# Patient Record
Sex: Male | Born: 1970 | Race: White | Hispanic: No | Marital: Single | Smoking: Current every day smoker
Health system: Southern US, Community
[De-identification: ages and names within clinical notes are randomized; demographics above are authoritative.]

## PROBLEM LIST (undated history)

## (undated) HISTORY — PX: APPENDECTOMY: SHX54

---

## 1999-10-07 ENCOUNTER — Ambulatory Visit (HOSPITAL_COMMUNITY): Admission: RE | Admit: 1999-10-07 | Discharge: 1999-10-07 | Payer: Self-pay | Admitting: *Deleted

## 1999-10-13 ENCOUNTER — Ambulatory Visit (HOSPITAL_COMMUNITY): Admission: RE | Admit: 1999-10-13 | Discharge: 1999-10-13 | Payer: Self-pay | Admitting: *Deleted

## 2000-03-05 ENCOUNTER — Emergency Department (HOSPITAL_COMMUNITY): Admission: EM | Admit: 2000-03-05 | Discharge: 2000-03-05 | Payer: Self-pay | Admitting: Emergency Medicine

## 2001-05-23 ENCOUNTER — Inpatient Hospital Stay (HOSPITAL_COMMUNITY): Admission: RE | Admit: 2001-05-23 | Discharge: 2001-05-23 | Payer: Self-pay | Admitting: Neurosurgery

## 2001-05-23 ENCOUNTER — Encounter: Payer: Self-pay | Admitting: Neurosurgery

## 2002-05-15 ENCOUNTER — Encounter: Payer: Self-pay | Admitting: Neurosurgery

## 2002-05-15 ENCOUNTER — Observation Stay (HOSPITAL_COMMUNITY): Admission: RE | Admit: 2002-05-15 | Discharge: 2002-05-15 | Payer: Self-pay | Admitting: Neurosurgery

## 2002-12-23 ENCOUNTER — Emergency Department (HOSPITAL_COMMUNITY): Admission: EM | Admit: 2002-12-23 | Discharge: 2002-12-24 | Payer: Self-pay | Admitting: Emergency Medicine

## 2002-12-24 ENCOUNTER — Encounter: Payer: Self-pay | Admitting: Emergency Medicine

## 2003-09-23 ENCOUNTER — Inpatient Hospital Stay (HOSPITAL_COMMUNITY): Admission: RE | Admit: 2003-09-23 | Discharge: 2003-09-24 | Payer: Self-pay | Admitting: Neurosurgery

## 2008-06-27 ENCOUNTER — Emergency Department (HOSPITAL_COMMUNITY): Admission: EM | Admit: 2008-06-27 | Discharge: 2008-06-27 | Payer: Self-pay | Admitting: Emergency Medicine

## 2008-09-21 ENCOUNTER — Ambulatory Visit (HOSPITAL_COMMUNITY): Admission: RE | Admit: 2008-09-21 | Discharge: 2008-09-22 | Payer: Self-pay | Admitting: Neurosurgery

## 2009-01-22 ENCOUNTER — Encounter: Admission: RE | Admit: 2009-01-22 | Discharge: 2009-01-22 | Payer: Self-pay | Admitting: Neurosurgery

## 2009-02-18 ENCOUNTER — Inpatient Hospital Stay (HOSPITAL_COMMUNITY): Admission: RE | Admit: 2009-02-18 | Discharge: 2009-02-19 | Payer: Self-pay | Admitting: Neurosurgery

## 2010-07-20 IMAGING — CT CT L SPINE W/O CM
4 of 10 series · 12 of 33 positions shown, 14 images · non-contrast
Comparison: Intraoperative fluoroscopic films 09/21/2008.

CLINICAL DATA: Low back pain.  Evaluate hardware.  Status post L4-
L5 fusion.

CT LUMBAR SPINE WITHOUT CONTRAST
TECHNIQUE: Multidetector CT imaging of the lumbar spine was
performed without intravenous contrast administration. Multiplanar
CT image reconstructions were also generated.

[Series 3: bone windows · axial · 0.27mm/px · z∈[-236,-158]mm · 2 of 93 slices shown, 3 images]
[im 31/93  soft-tissue]
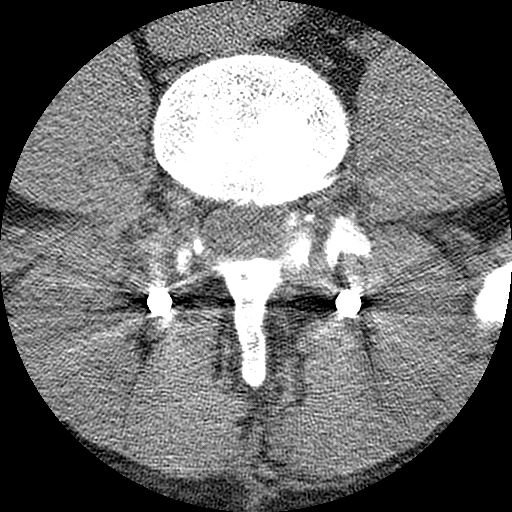
[im 31/93  bone]
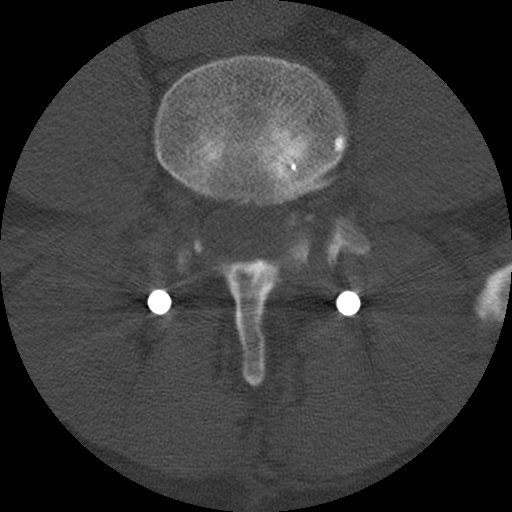
[im 62/93  bone]
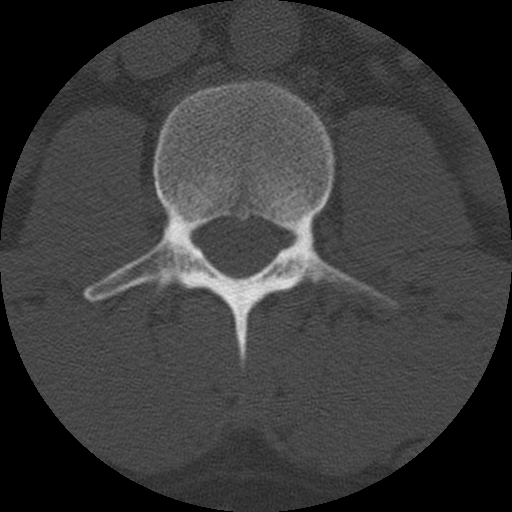

[Series 4: detail windows · axial · 0.27mm/px · z∈[-236,-158]mm · 2 of 93 slices shown]
[im 31/93  bone]
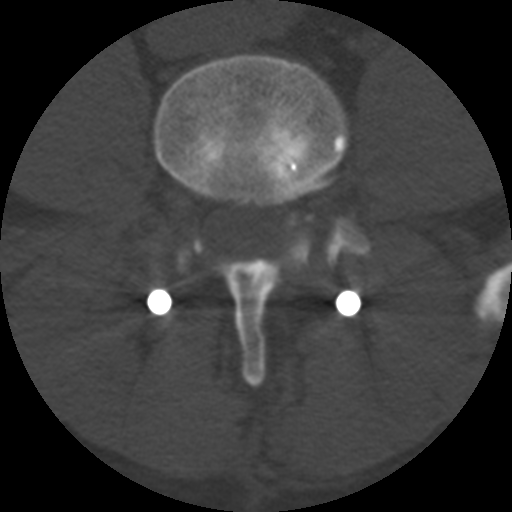
[im 62/93  bone]
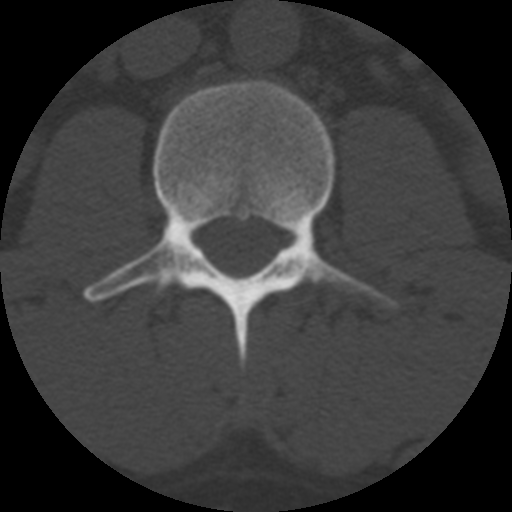

[Series 200: cor · coronal · 0.46mm/px · 3 of 40 slices shown]
[im 8/40  bone]
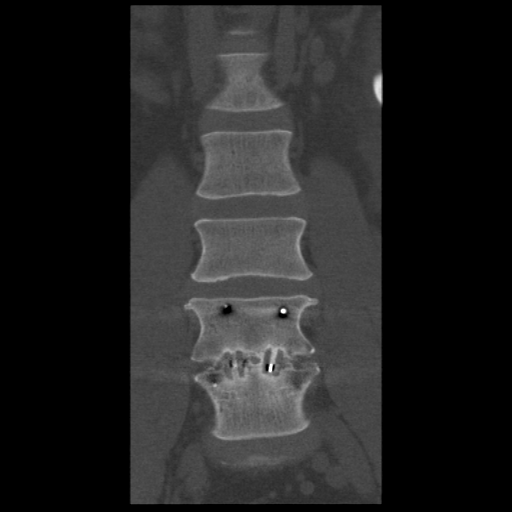
[im 16/40  bone]
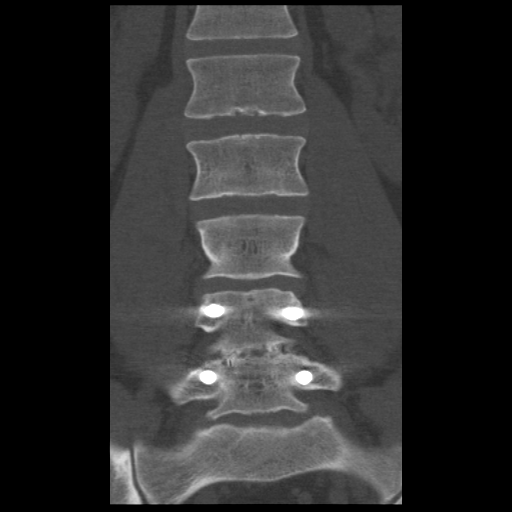
[im 24/40  bone]
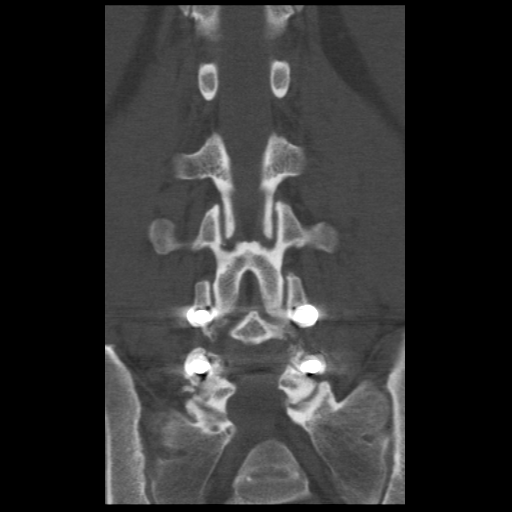

[Series 201: sag · sagittal · 0.46mm/px · 5 of 39 slices shown, 6 images]
[im 13/39  bone]
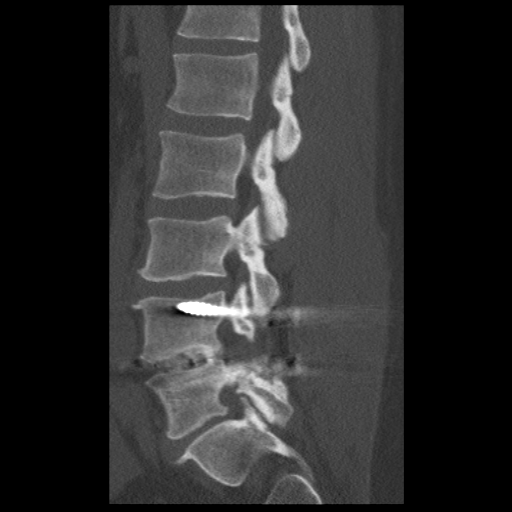
[im 16/39  bone]
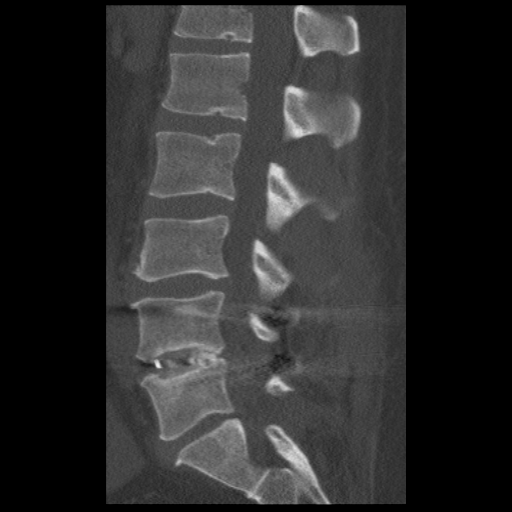
[im 20/39  soft-tissue]
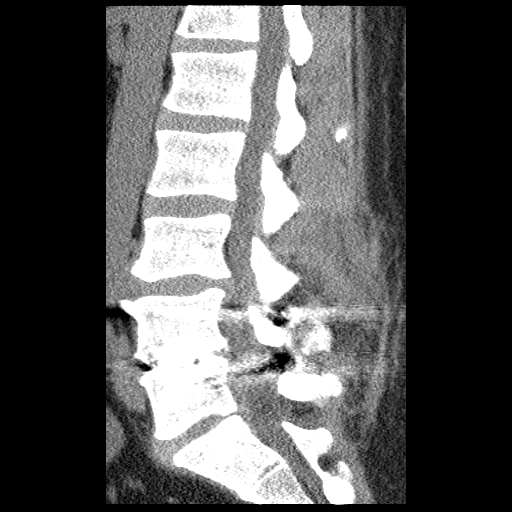
[im 20/39  bone]
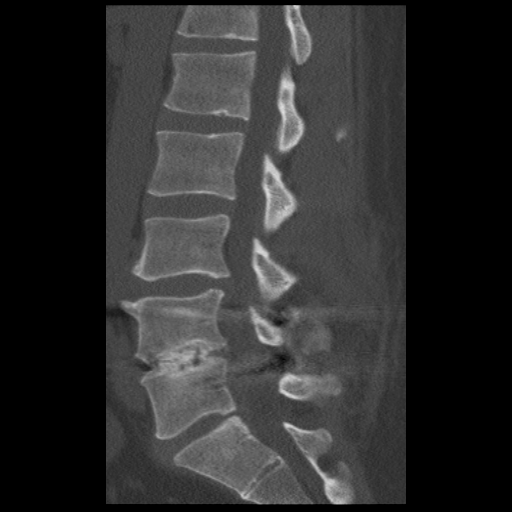
[im 23/39  bone]
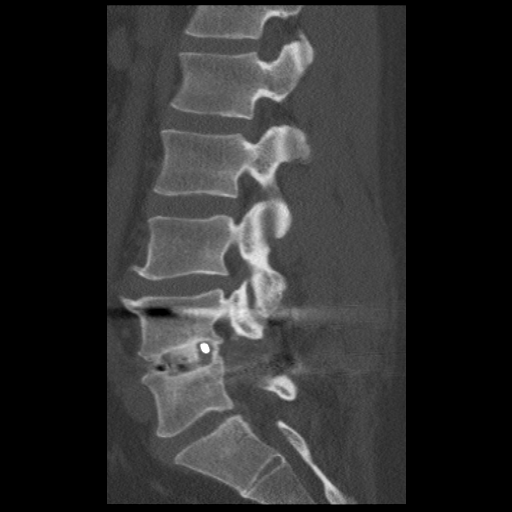
[im 26/39  bone]
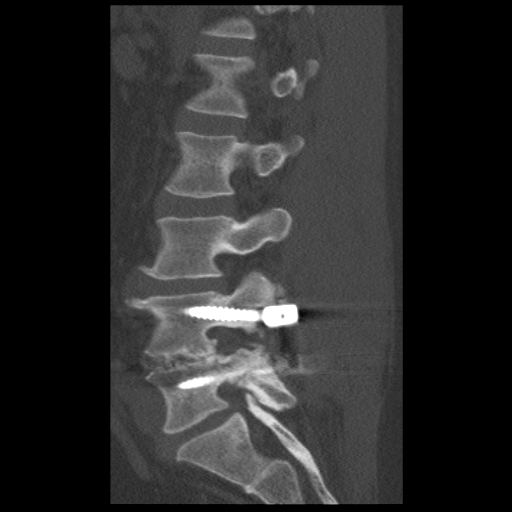

[12 of 33 positions shown; findings below may reference images not displayed]

FINDINGS: Alignment of the lumbar spine shows grade 1
retrolisthesis of L3 on L4.  PLIF at L4-L5.  The screws at L4-L5
are well positioned, without intrusion into the neural foramina.
T12-L1, L1-L2, and L2-L3 appear within normal limits.

L3-L4:  Disc degeneration with anterior osteophytes and broad-based
posterior disc protrusion which is partially calcified.  There is
moderate central canal stenosis associated with disc protrusion and
facet hypertrophy.  No definite foraminal narrowing is identified.

L4-L5:  Posterior lumbar interbody fusion with bilateral
facetectomy and bilateral laminotomies.  There is no evidence of
hardware loosening or failure.  Bridging bone crosses the
interspace.  Interbody bone graft is in good position. No
recurrent/residual stenosis.

L5-S1:  Bilateral L5 pars defects are present without
anterolisthesis.  Right L5 laminotomy.  Broad-based disc bulge with
small foraminal endplate osteophytes on the right with moderate
right foraminal stenosis.  Mild left foraminal stenosis associated
with spurring at the site of pars defect.  Bulging disc
contributes.  Small endplate osteophytes.

Sacrum and SI joints appear within normal limits.
IMPRESSION: 1.  Uncomplicated L4-L5 PLIF with good decompression.  No evidence
of hardware failure or loosening.  Solid fusion across the disc
space.
2.  Bilateral L5 pars defects without anterolisthesis.  Right L5
laminotomy.  Mild bilateral foraminal narrowing associated with
spurring from pseudoarthrosis and bulging disc, right greater than
left.
3.  L3-L4 degenerative disc disease with mild central stenosis.

## 2010-11-06 LAB — CBC
HCT: 50 % (ref 39.0–52.0)
Hemoglobin: 17.3 g/dL — ABNORMAL HIGH (ref 13.0–17.0)
MCHC: 34.5 g/dL (ref 30.0–36.0)
MCV: 91.2 fL (ref 78.0–100.0)
Platelets: 236 10*3/uL (ref 150–400)
RBC: 5.48 MIL/uL (ref 4.22–5.81)
RDW: 13.1 % (ref 11.5–15.5)
WBC: 7.7 10*3/uL (ref 4.0–10.5)

## 2010-11-06 LAB — TYPE AND SCREEN
ABO/RH(D): O POS
Antibody Screen: NEGATIVE

## 2010-11-06 LAB — ABO/RH: ABO/RH(D): O POS

## 2010-11-15 LAB — CBC
HCT: 44.1 % (ref 39.0–52.0)
Hemoglobin: 15.5 g/dL (ref 13.0–17.0)
MCHC: 35.2 g/dL (ref 30.0–36.0)
MCV: 89.4 fL (ref 78.0–100.0)
Platelets: 225 10*3/uL (ref 150–400)
RBC: 4.93 MIL/uL (ref 4.22–5.81)
RDW: 14.3 % (ref 11.5–15.5)
WBC: 6.7 10*3/uL (ref 4.0–10.5)

## 2010-12-13 NOTE — Op Note (Signed)
NAME:  [name] [name], [name] [name]              ACCOUNT NO.:  [id number]   MEDICAL RECORD NO.:  [id number]          PATIENT TYPE:  INP   LOCATION:  3009                         FACILITY:  [facility]   PHYSICIAN:  Hewitt Shorts, M.D.DATE OF BIRTH:  1971-03-18   DATE OF PROCEDURE:  DATE OF DISCHARGE:                               OPERATIVE REPORT   PREOPERATIVE DIAGNOSES:  Recurrent right L5-S1 lumbar disk herniation,  lumbar degenerative disk disease, lumbar spondylosis, bilateral L5 pars  interarticularis defect and right lumbar radiculopathy.   POSTOPERATIVE DIAGNOSES:  Recurrent right L5-S1 lumbar disk herniation,  lumbar degenerative disk disease, lumbar spondylosis, bilateral L5 pars  interarticularis defect and right lumbar radiculopathy.   PROCEDURE:  L5 Gill procedure with laminectomy, facetectomy, and  foraminotomy with decompression of the exiting L5 and S1 nerve roots  bilaterally with decompression beyond that required for interbody  fusion, bilateral L5-S1 lumbar diskectomy with microdissection with  specific removal including removal of recurrent right L5-S1 disk  herniation and bilateral L5-S1 posterior lumbar interbody fusion with  AVS PEEK interbody implants and mosaic with bone marrow aspirate and  Infuse and bilateral L5-S1 posterolateral arthrodesis with mosaic with  bone marrow aspirates and Infuse.   SURGEON:  Hewitt Shorts, M.D.   ASSISTANTS:  Nelia Shi. Webb Silversmith, RN and Danae Orleans. Venetia Maxon, M.D.   ANESTHESIA:  General endotracheal.   INDICATIONS:  The patient is a 40 year old man, he is status post  previous L4-5 decompression, PLIF and TLA, in 2005 with 90D posterior  instrumentation, status post a right L5-S1 diskectomy earlier this year,  suffered recurrent disk herniation and a decision was made to proceed  with decompression and arthrodesis.  Preoperative CT to assess the  fusion at L4-5 revealed bilateral L5 pars interarticularis defect.  The  patient had 90D  instrumentation previous L4-5 fusion, however, the plan  was to place radius screws at S1.   PROCEDURE IN DETAIL:  The patient was brought to the operating room and  placed under general endotracheal anesthesia.  The patient was turned to  a prone position.  Lumbar region was prepped with Betadine soap and  solution and draped in a sterile fashion.  The midline was infiltrated  with local anesthetic with epinephrine.  The previous midline incision  was reopened and extended rostrally and caudally.  Dissection was  carried down to the subcutaneous tissue to the lumbar fascia which was  incised bilaterally.  The paraspinal muscles were dissected from the  spinous process and lamina in a subperiosteal fashion.  Particular care  was taken because of previous bilateral  laminotomies at L4-5  bilaterally as well as the right L5-S1 laminotomy.  An x-ray was taken.  We identified the L4-L5 and S1 spinous processes and in fact it was  noted that the posterior elements of L5 were loose consistent with  bilateral pars defect.   Dissection was then carried out laterally exposing the posterior  instrumentation bilaterally at L4-5.  We exposed the L5-S1 interlaminar  space including the previous laminotomy on the right side and then we  proceeded with decompression.  The operating microscope was  draped and  brought to the field to provide additional navigation, illumination, and  visualization.  The remainder of the decompression was performed using  microdissection and microsurgical technique.  The laminotomies were  extended bilaterally and then we continued the decompression to a full  laminectomy and facetectomy was performed because this was an L5 Gill  procedure.  We exposed the superior articular process S1 and the  transverse process of L5.  Later in the case, these structures were  decorticated, has had good bony surfaces for the arthrodesis.   We began the diskectomy at L5-S1 on the left  side, annulus was  identified.  The overlying epidural veins were identified, coagulated  and divided.  The annulus was incised.  The disk space was entered using  a variety of microcurettes and pituitary rongeurs.  A thorough  diskectomy was performed.  We then began to prepare the endplates using  paddle curettes.  We removed the cauterized endplates surface and the  disk space was sized.  We then proceeded with dissection on the right  side with a previous laminotomy.  Diskectomy has been done.  There was  moderate amount of scar tissue.  We identified the L5 nerve root and the  S1 nerve root, slowly opened the scar tissue and found a moderate-sized  disk herniation compressing the exiting right S1 nerve root.  This was  removed in a piecemeal fashion.  We then entered into the disk space and  continued diskectomy using a variety of microcurettes and pituitary  rongeurs.  We then began preparing the end plate on the right side of  the L5-S1 with a paddle curettes to remove the cartilaginous endplate  surface and again the disk space was sized.   We then removed the operating microscope and brought in the C-arm  fluoroscope.  We identified the left S1 pedicle and was probed with a  pedicle probe.  Bone mass was aspirated from the vertebral body which  was injected over a 15-mL strip of Mosaic and then we selected 9 x 25 x  4 degree AVS PEEK implants.  We packed with Mosaic, then small pledget  of Infuse, then more Mosaic and then we first placed the implants on the  right side retracting the thecal sac and nerve root was gently tapped  into position.  Going to the left side we packed the midline with  additional Mosaic with bone marrow aspirate.  Then, we placed the second  implant, again retracting the thecal sac and nerve root medially on the  left side.  Both the implants were countersunk.  We then went ahead and  probed the right S1 pedicle again using the C-arm fluoroscopic  guidance.  The S1 pedicles were examined with ball probe, good bony surfaces were  noted.  Each was tapped with a 5.25 mm tap.  Again, examined with a ball  probe, good bony surface was noted, good threading was noted, then we  placed a 5.75 x 35 mm screws bilaterally at S1.   We went ahead and removed the locking caps from the 90D posterior  instrumentation at L4-5.  All 4 locking caps were marked then we removed  the rods.  The screw heads were cleaned of scar tissue and then we  selected a prelordosed radius rods 60 mm on the right, 50 mm on the  left, good placement of screw heads and locked down with locking caps  using radius locking caps on the S1 radius screws  and 90D locking caps  on the L4 and L5, 90D screws.  Once all 6 locking caps were in place,  final tightening was done with the appropriate final tightener.  We then  packed the remaining Infuse and Mosaic with bone marrow aspirate in the  lateral gutter at the L5-S1 level.  Once that was completed, we  reexamined the spinal canal, thecal sac, and the L5 and S1 nerve roots  which were well decompressed bilaterally, and then we proceeded with the  closure.  The wound had been irrigated numerous times during the  procedure with bacitracin solution and saline solution.  The paraspinal  muscle was reapproximated with interrupted undyed #1 Vicryl sutures.  The deep fascia was closed with interrupted undyed #1 Vicryl sutures.  Scarpa fascia was closed with interrupted undyed #1 Vicryl sutures.  The  subcutaneous and subcuticular were closed with interrupted inverted 2-0  undyed Vicryl sutures.  The skin was reapproximated with Dermabond.  The  wound was dressed with Adaptic, sterile gauze, and Hyperfix.  The  procedure was tolerated well.  We did use a cell saver during the  procedure, but there was insufficient blood loss to process the  collected blood.  The estimated blood loss was 150 mL.  Sponge and  needle count were  correct.  Following surgery, the patient was turned  back to the supine position, to be reversed from the anesthetic,  extubated, and transferred to the recovery room for further care.     Hewitt Shorts, M.D.  Electronically Signed    RWN/MEDQ  D:  02/18/2009  T:  02/19/2009  Job:  [id number]

## 2010-12-13 NOTE — Op Note (Signed)
NAME:  [name] [name]              ACCOUNT NO.:  [id number]   MEDICAL RECORD NO.:  [id number]           PATIENT TYPE:   LOCATION:                                 FACILITY:   PHYSICIAN:  Hewitt Shorts, M.D.DATE OF BIRTH:  Sep 21, 1970   DATE OF PROCEDURE:  DATE OF DISCHARGE:                               OPERATIVE REPORT   PREOPERATIVE DIAGNOSES:  1. Right L5-S1 lumbar disk herniation.  2. Lumbar degenerative disk disease.  3. Lumbar spondylosis.  4. Lumbar radiculopathy.   POSTOPERATIVE DIAGNOSES:  1. Right L5-S1 lumbar disk herniation.  2. Lumbar degenerative disk disease.  3. Lumbar spondylosis.  4. Lumbar radiculopathy.   PROCEDURES:  Right L5-S1 lumbar laminotomy and microdiskectomy with  microdissection.   SURGEON:  Hewitt Shorts, M.D.   ASSISTANT:  1. Nelia Shi. Webb Silversmith, NP  2. Hilda Lias, M.D.   ANESTHESIA:  General endotracheal.   INDICATIONS:  The patient is a 40 year old man who presented with lumbar  radiculopathy.  MRI scan showed a right L5-S1 lumbar disk herniation  superimposed upon underlying degenerative disk disease, spondylosis.  A  decision was made to proceed with elective laminotomy and  microdiskectomy.   PROCEDURE IN DETAIL:  The patient was brought to the operating room and  placed under general endotracheal anesthesia.  The patient was turned to  prone position.  Lumbar region was prepped with Betadine soap solution  and draped in a sterile fashion.  The midline was infiltrated with local  anesthetic with epinephrine and x-ray was taken.  The L5-S1 level was  identified.  A midline incision was made through a previous midline  incision and carried down through the subcutaneous tissue.  Bipolar  electrocautery was used to maintain hemostasis.  Dissection was carried  down to the lumbar fascia, which was incised on the right side of the  midline.  The paraspinal muscles were dissected from the spinous process  and lamina in a  subperiosteal fashion.  Self-retaining retractor were  placed in the L5-S1 intralaminar space was identified.  An x-ray was  taken to confirm the localization.  Then, the microscope was draped and  brought to the field to provide additional magnification, illumination,  and visualization.  The remainder of the decompression was performed  using microdissection and microsurgical technique.  Laminotomy was  performed using the [employer] electric high-speed drill and Kerrison  punches.  The bone edges were waxed as needed to maintain hemostasis.  Ligamentum flavum was carefully resected and we exposed the thecal sac  and exiting right S1 nerve root.  These structures were gently retracted  immediately and the disk herniation identified.  We then began a  diskectomy by incising the annulus and entering into the disk space.  The disk herniation was subligamentous.  Using microcurettes, we were  able to mobilize the disk herniation and the fragments were removed.  We  removed the extensive amount of degenerative disk material from within  the disk space itself and all loose fragments of the disk material were  removed from both the disk space and the epidural space.  Good  decompression  of thecal sac and nerve root was achieved.  There was  moderate spondylitic overgrowth and some of the overgrown bony spurring  and ligament tissue was removed as well.  In the end, good decompression  of neural structures was achieved.  Hemostasis was established through  the use of bipolar cautery and in the end, once the decompression was  completed and hemostasis was established, we proceeded with closure.  Prior to closure, we instilled 2 mL of fentanyl and 80 mg Depo-Medrol  into the epidural space.  The deep fascia was closed with interrupted  undyed #1 Vicryl sutures.  The scarpa fascia was closed with interrupted  undyed #1 Vicryl sutures.  The subcutaneous and subcuticular were closed  with interrupted  inverted 2-0 and 3-0 undyed Vicryl sutures.  The skin  was approximated with Dermabond and the wound was dressed with Adaptic  and sterile gauze.  The procedure was tolerated well.  The estimated  blood loss was 50 mL.  Sponge count was correct.  Following surgery, the  patient was turned back to the supine position to be reversed from the  anesthetic, extubated, and transferred to the recovery room for further  care.      Hewitt Shorts, M.D.  Electronically Signed     RWN/MEDQ  D:  09/21/2008  T:  09/22/2008  Job:  [id number]

## 2010-12-16 NOTE — Op Note (Signed)
[place]  Patient:    [name] [name], [name] [name] Visit Number: [id number] MRN: [id number]          Service Type: SUR Location: [id number] Attending Physician:  Barton Fanny Dictated by:   Hewitt Shorts, M.D. Proc. Date: 05/23/01 Admit Date:  05/23/2001                             Operative Report  PREOPERATIVE DIAGNOSIS:  Left L4-5 lumbar disk herniation.  POSTOPERATIVE DIAGNOSIS:  Left L4-5 lumbar disk herniation.  PROCEDURE:  Left L4-5 lumbar laminotomy and microdiskectomy.  SURGEON:  Hewitt Shorts, M.D.  ASSISTANT:  Cristi Loron, M.D.  ANESTHESIA:  General endotracheal.  INDICATION:  The patient is a 40 year old man who presented with an acute left lumbar radiculopathy and was found by MRI scan to have left L4-5 disk herniation with a fragment that had migrated caudally behind the body of L5. Decision made to proceed with elective laminotomy and microdiskectomy.  DESCRIPTION OF PROCEDURE:  The patient was brought into the operating room and was induced under general endotracheal anesthesia.  The patient was turned to a prone position.  The lumbar region was prepped with Betadine soap and solution and draped in a sterile fashion.  The midline was infiltrated with local anesthetic with epinephrine and an x-ray was taken with the L4-5 level identified and a midline incision made, carried down through the subcutaneous tissue.  Bipolar cautery and electrocautery used to maintain hemostasis. Attention was carried down to the lumbar fascia, which was incised on the left side of the midline and the paraspinal muscles were stripped from the spinous processes and the laminae in a subperiosteal fashion.  The L4-5 interlaminar space was identified and an x-ray was taken to confirm the localization, and then the microscope was draped and brought into the field to provide additional magnification, illumination, and  visualization, and the remainder of the procedure was performed using microdissection and microsurgical technique.  A laminotomy was performed using the [facility] Max drill and Kerrison punches.  Ligamentum flavum was carefully resected and the underlying thecal sac and left L5 nerve root were identified.  These were gently retracted medially and a disk herniation that was located below the level of the disk space was identified.  This free fragment was mobilized from the surrounding epidural tissues and excised and removed.  We then examined the disk space, which was clearly degenerated.  There was spondylitic disk protrusion; however, it was not felt that there was a significant nerve or thecal sac compression, and therefore epidural veins were coagulated as necessary, and the epidural space was examined for any further fragments of disk herniation and none were found.  It was felt that good decompression of the thecal sac and nerve had been achieved, and therefore we proceeded with closure.  Prior to closure, 2 cc of fentanyl and 80 mg of Depo-Medrol were infused into the epidural space and then the deep fascia closed with interrupted, undyed 1 Vicryl sutures, the subcutaneous and subcuticular layer were closed with interrupted inverted 2-0 undyed Vicryl sutures, and the skin was reapproximated with Dermabond.  The patient tolerated the procedure well.  The estimated blood loss was less than 25 cc.  Sponge and needle count were correct.  Following surgery the patient was turned back to the supine position, to be reversed from the anesthetic, extubated, and transferred to the recovery room for further  care. Dictated by:   Hewitt Shorts, M.D. Attending Physician:  Barton Fanny DD:  05/23/01 TD:  05/24/01 Job: [id number]

## 2010-12-16 NOTE — Op Note (Signed)
NAME:  [name] [name], [name] [name]                        ACCOUNT NO.:  [id number]   MEDICAL RECORD NO.:  [id number]                   PATIENT TYPE:  INP   LOCATION:  3001                                 FACILITY:  [facility]   PHYSICIAN:  Hewitt Shorts, M.D.            DATE OF BIRTH:  Nov 11, 1970   DATE OF PROCEDURE:  09/23/2003  DATE OF DISCHARGE:                                 OPERATIVE REPORT   PREOPERATIVE DIAGNOSES:  1. Recurrent left L4-5 lumbar disk herniation.  2. Lumbar spondylosis.  3. Lumbar degenerative disk disease.  4. Lumbar radiculopathy.   POSTOPERATIVE DIAGNOSES:  1. Recurrent left L4-5 lumbar herniation.  2. Lumbar spondylosis.  3. Lumbar degenerative disk disease.  4. Lumbar radiculopathy.   PROCEDURE:  Bilateral L4-5 lumbar laminotomy, microdiskectomy, and posterior  lumbar interbody fusion with globus PEEK interbody cages with Vitoss with  bone marrow aspirate and bilateral L4-5 posterolateral arthrodesis with  _________  posterior instrumentation and Vitoss with bone marrow aspirate  with microdissection.   SURGEON:  Hewitt Shorts, M.D.   ASSISTANT:  None.   ANESTHESIA:  General endotracheal.   INDICATIONS:  The patient is a 40 year old man who has undergone lumbar  diskectomies on the left side at L4-5 in October 2002 and again in October  2003.  He has since suffered a recurrent disk herniation with apparent  degenerative disk disease and spondylosis at that level and the decision was  made to proceed with diskectomy and arthrodesis.   PROCEDURE:  The patient was brought to the operating room and placed under  general endotracheal anesthesia.  The patient was turned to the prone  position.  Lumbar region was prepped with Betadine Scrub and Solution and  draped in a sterile fashion.  An x-ray was taken,  the L4-5 level identified  and then a midline incision was made through the previous midline incision  and extended both rostrally and caudally to  provide additional exposure.  Dissection was carried down to the subcutaneous tissue.  Bipolar  electrocautery was used to maintain hemostasis.  Dissection was carried down  to the level of the fascia, which was excised bilaterally, and the  paraspinous muscle was  dissected from the spinous process lamina in  subperiosteal fashion.  A self-retaining retractor was placed and the L4-5  interlaminar space identified.  Previous laminotomy was noted and x-ray was  taken to confirm the localization and then an L4-5 laminotomy was performed  with facetectomy bilaterally.  The thecal sac and L4 and L5 nerve roots were  identified bilaterally.  There was moderate scar tissue on the left side  with a previous laminotomy.  We identified the disk space and brought in the  operating microscope and proceeded with microdissection and microsurgery to  decompress the thecal sac and nerve roots.  The diskectomy was performed by  incising the annulus and using a variety of pituitary rongeurs and  microcurettes.  The fragmented  disk herniation on the left side was  carefully dissected from the surrounding epidural scar tissue and removed,  decompressing the thecal sac and nerve root.  The endplates of the  corresponding vertebra were repaired using a variety of endplate curettes  and good decompression of the thecal sac and nerve roots was achieved and  diskectomy was completed.  We then used a variety of spacers to size the  proper interbody implant.  We selected a 13 mm high and 8 mm wide interbody  implant.  Spondylitic overgrowths in the posterior aspects of the L4 and L5  vertebral body were removed using Kerrison punches.   We then went ahead and identified the pedicle entry sites for L5 bilaterally  and using a pedicle probe we were able to probe through the pedicle into the  vertebral body.  We were able to aspirate 30 mL of bone marrow aspirate from  the two pedicle entry sites.  This was injected  with over a total of 20 cc  of Vitoss with foam.  We then selected the PEEK interbody implants that were  packed with the Vitoss with bone marrow aspirate and then positioned in the  intervertebral disk space and countersunk.  We then took additional Vitoss  with bone marrow aspirate and packed it both medial to the two interbody  implants as well as lateral to them.  It was tamped in the __________  position with a small tamp, and once this was completed we brought in the C-  arm fluoroscopic unit to provide additional fluoroscopic guidance for  placement of the pedicle probes at L4.  Those pedicle entry sites were  identified.  The overlying cortex was opened with the [facility] drill.  The  pedicle probes were passed into the pedicles and vertebral body.  We then  proceeded to examine each of them with the ball probe.  No cutouts were  found.  Each of the pedicles were then tapped with a 5.25 tap, and we  selected 6.75 x 40 mm screws.  After each pedicle was tapped, it was again  examined with the ball probe.  Good threading was noted and no cutouts were  found.  We then placed each of the four screws that are fully secured in  place.  We then viewed it with an AP view with the C-arm fluoroscope and  found good trapezoidal trajectories.  The fluoroscopic unit was then removed  and we proceeded with the arthrodesis.  We cut a 70 mm rod in half.  It was  placed in the screw head and secured with locking caps.  Final tightening  was done against the counter-torque.  We had previously exposed and  identified the transverse process at L4 and L5 bilaterally.  These were  decorticated and then the additional Vitoss with bone marrow aspirate was  packed over them.   We then irrigated the wound with Bacitracin solution, checked for hemostasis  and proceeded with closure.  The deep fascia was closed with undyed 1 Vicryl  suture.  The subcutaneous fascia was closed with undyed 1 Vicryl suture. The  subcutaneous tissues were closed with interrupted inverted 2-0 undyed  Vicryl sutures.  The skin edge was approximated with Dermabond.   The patient tolerated the procedure well.  The estimated blood loss was 200  cc.  Sponge count correct.  The patient was subsequently turned to the  supine position to be reversed, extubated and transferred to the recovery  room for  further care where he was noted to be moving all four extremities.                                               Hewitt Shorts, M.D.    RWN/MEDQ  D:  09/23/2003  T:  09/24/2003  Job:  [id number]

## 2010-12-16 NOTE — Op Note (Signed)
NAME:  [name] [name], [name] [name]                        ACCOUNT NO.:  [id number]   MEDICAL RECORD NO.:  [id number]                   PATIENT TYPE:  INP   LOCATION:  2892                                 FACILITY:  [facility]   PHYSICIAN:  Hewitt Shorts, M.D.            DATE OF BIRTH:  27-Jan-1971   DATE OF PROCEDURE:  05/15/2002  DATE OF DISCHARGE:                                 OPERATIVE REPORT   PREOPERATIVE DIAGNOSIS:  Recurrent left L4-5 lumbar disk herniation.   POSTOPERATIVE DIAGNOSIS:  Recurrent left L4-5 lumbar disk herniation.   PROCEDURE:  Left L4-5 lumbar laminotomy and microdiskectomy.   SURGEON:  Hewitt Shorts, M.D.   ASSISTANT:  Cristi Loron, M.D.   ANESTHESIA:  General endotracheal.   INDICATIONS:  This is a 40 year old man about one year status post left L4-5  lumbar diskectomy.  He recovered well from that but developed recurrent  radiculopathy and was found by MRI scan to have a large left L4-5 recurrent  disk herniation.  A decision made to proceed with a left L4-5 laminotomy and  microdiskectomy.   DESCRIPTION OF PROCEDURE:  The patient was brought to the operating room and  placed under general endotracheal anesthesia.  The patient was turned to a  prone position and the lumbar region was prepped with Betadine soap and  solution and draped in a sterile fashion.  A localizing x-ray was taken, and  the previous midline incision was reopened and dissection was carried down  through the subcutaneous tissue.  Dissection was carried down to the lumbar  fascia, which was incised on the left side of the midline.  The paraspinal  muscles were dissected from the spinous processes and laminae in a  subperiosteal fashion.  The previous laminotomy defect was identified and  another x-ray was taken and the L4-5 interlaminar space identified.  There  was a lot of scar tissue within the previous laminotomy defect.  A small  rent in the dura was found, and this was  closed with 6-0 Prolene suture.  We  extended the laminotomy rostrally using the [facility] drill and Kerrison  punches, and the microscope was draped and brought into the field to provide  additional magnification, illumination, and visualization, and the remainder  of the procedure was performed using microdissection and microsurgical  technique.  The epidural space was carefully examined, and we were able to  dissect free this scar tissue and mobilize the thecal sac and nerve root  medially.  The annulus was identified and incised.  The annulus proceeded  thorough diskectomy, removing a fragment of disk herniation and entering the  disk space and removing additional degenerated disk material.  All loose  fragments of disk material were removed from both the disk space and the  epidural space, and a good decompression of the thecal sac and nerve root  was achieved.  Once the diskectomy was completed, hemostasis was established  with the use of bipolar cautery and Gelfoam soaked in thrombin; however,  prior to closure all the Gelfoam was removed.  We did Valsalva the patient;  no CSF leakage was noted.  Tisseel was placed over the dural repair and then  the deep fascia closed with interrupted, undyed 1 Vicryl sutures, the  subcutaneous layer was closed with interrupted, inverted undyed 1 Vicryl  sutures, the subcutaneous and subcuticular layer were closed with  interrupted, inverted 2-0 undyed  Vicryl suture, and the skin was closed with Dermabond.  The patient was  tolerated well, and the estimated blood loss was 50 cc.  Sponge and needle  count were correct.  Following surgery the patient was turned back to a  supine position, to be reversed from anesthetic, extubated, and transferred  to the recovery room for further care.                                               Hewitt Shorts, M.D.    RWN/MEDQ  D:  05/15/2002  T:  05/16/2002  Job:  [id number]

## 2011-02-21 ENCOUNTER — Other Ambulatory Visit: Payer: Self-pay | Admitting: Neurosurgery

## 2011-02-21 DIAGNOSIS — M5126 Other intervertebral disc displacement, lumbar region: Secondary | ICD-10-CM

## 2011-02-24 ENCOUNTER — Other Ambulatory Visit: Payer: Self-pay

## 2011-02-27 ENCOUNTER — Ambulatory Visit
Admission: RE | Admit: 2011-02-27 | Discharge: 2011-02-27 | Disposition: A | Discharge status: Home / Self Care | Payer: Worker's Compensation | Source: Ambulatory Visit | Attending: Neurosurgery | Admitting: Neurosurgery

## 2011-02-27 DIAGNOSIS — M5126 Other intervertebral disc displacement, lumbar region: Secondary | ICD-10-CM

## 2012-03-12 ENCOUNTER — Encounter: Payer: Self-pay | Admitting: Family Medicine

## 2021-10-28 ENCOUNTER — Ambulatory Visit (INDEPENDENT_AMBULATORY_CARE_PROVIDER_SITE_OTHER): Payer: BC Managed Care – PPO | Admitting: Family Medicine

## 2021-10-28 ENCOUNTER — Encounter: Payer: Self-pay | Admitting: Family Medicine

## 2021-10-28 VITALS — BP 122/81 | HR 60 | Temp 98.0°F | Resp 16 | Ht 72.0 in | Wt 227.6 lb

## 2021-10-28 DIAGNOSIS — Z0000 Encounter for general adult medical examination without abnormal findings: Secondary | ICD-10-CM | POA: Diagnosis not present

## 2021-10-28 DIAGNOSIS — F17210 Nicotine dependence, cigarettes, uncomplicated: Secondary | ICD-10-CM | POA: Diagnosis not present

## 2021-10-28 DIAGNOSIS — Z130 Encounter for screening for diseases of the blood and blood-forming organs and certain disorders involving the immune mechanism: Secondary | ICD-10-CM

## 2021-10-28 DIAGNOSIS — F17200 Nicotine dependence, unspecified, uncomplicated: Secondary | ICD-10-CM

## 2021-10-28 DIAGNOSIS — Z114 Encounter for screening for human immunodeficiency virus [HIV]: Secondary | ICD-10-CM

## 2021-10-28 DIAGNOSIS — Z7689 Persons encountering health services in other specified circumstances: Secondary | ICD-10-CM

## 2021-10-28 DIAGNOSIS — Z1159 Encounter for screening for other viral diseases: Secondary | ICD-10-CM

## 2021-10-28 DIAGNOSIS — Z1211 Encounter for screening for malignant neoplasm of colon: Secondary | ICD-10-CM

## 2021-10-28 DIAGNOSIS — Z13220 Encounter for screening for lipoid disorders: Secondary | ICD-10-CM

## 2021-10-28 DIAGNOSIS — Z125 Encounter for screening for malignant neoplasm of prostate: Secondary | ICD-10-CM

## 2021-10-28 NOTE — Progress Notes (Signed)
Patient is her to est care with PCP. Patient said he is getting older and need to see a PCP. Patient had no issues today other than wanting an overall check-up ?Kieth Brightly ? ?

## 2021-10-28 NOTE — Progress Notes (Signed)
? ?  New Patient Office Visit ? ?Subjective:  ?Patient ID: [name] [name], male    DOB: 07/18/1972  Age: 51 y.o. MRN: [id number] ? ?CC:  ?Chief Complaint  ?Patient presents with  ? Establish Care  ? ? ?HPI ?[name] [name] presents for to establish care and for routine annual exam. He denies acute complaints or concerns.  ? ?History reviewed. No pertinent past medical history. ? ?History reviewed. No pertinent surgical history. ? ?No family history on file. ? ?Social History  ? ?Socioeconomic History  ? Marital status: Single  ?  Spouse name: Not on file  ? Number of children: Not on file  ? Years of education: Not on file  ? Highest education level: Not on file  ?Occupational History  ? Not on file  ?Tobacco Use  ? Smoking status: Not on file  ? Smokeless tobacco: Not on file  ?Substance and Sexual Activity  ? Alcohol use: Not on file  ? Drug use: Not on file  ? Sexual activity: Not on file  ?Other Topics Concern  ? Not on file  ?Social History Narrative  ? Not on file  ? ?Social Determinants of Health  ? ?Financial Resource Strain: Not on file  ?Food Insecurity: Not on file  ?Transportation Needs: Not on file  ?Physical Activity: Not on file  ?Stress: Not on file  ?Social Connections: Not on file  ?Intimate Partner Violence: Not on file  ? ? ?ROS ?Review of Systems  ?All other systems reviewed and are negative. ? ?Objective:  ? ?Today's Vitals: There were no vitals taken for this visit. ? ?Physical Exam ?Vitals and nursing note reviewed.  ?Constitutional:   ?   General: He is not in acute distress. ?Cardiovascular:  ?   Rate and Rhythm: Normal rate and regular rhythm.  ?Pulmonary:  ?   Effort: Pulmonary effort is normal.  ?   Breath sounds: Normal breath sounds.  ?Abdominal:  ?   Palpations: Abdomen is soft.  ?   Tenderness: There is no abdominal tenderness.  ?Musculoskeletal:  ?   Lumbar back: No swelling, deformity or signs of trauma. Decreased range of motion.  ?Neurological:  ?   General: No focal deficit  present.  ?   Mental Status: He is alert and oriented to person, place, and time.  ? ? ?Assessment & Plan:  ? ?1. Annual physical exam ?Labs ordered and are pending ?- CMP14+EGFR ? ?2. Screening for colon cancer ? ?- Ambulatory referral to Gastroenterology ? ?3. Screening for deficiency anemia ? ? ?4. Screening for HIV (human immunodeficiency virus) ? ?- HIV antibody (with reflex) ? ?5. Need for hepatitis C screening test ? ?- CBC with Differential ?- Hepatitis C Antibody ? ?6. Screening for endocrine/metabolic/immunity disorders ? ?- Hemoglobin A1c ? ?7. Screening for prostate cancer ? ?- PSA ? ?8. Screening for lipid disorders ? ? ?9. Smoker ?Discussed reduction/cessation ? ?10. Encounter to establish care ? ? ?No outpatient encounter medications on file as of 10/28/2021.  ? ?No facility-administered encounter medications on file as of 10/28/2021.  ? ? ?Follow-up: No follow-ups on file.  ? ?[name] Sax, MD ? ?

## 2021-10-29 LAB — CMP14+EGFR
ALT: 42 IU/L (ref 0–44)
AST: 34 IU/L (ref 0–40)
Albumin/Globulin Ratio: 2.6 — ABNORMAL HIGH (ref 1.2–2.2)
Albumin: 5 g/dL — ABNORMAL HIGH (ref 3.8–4.9)
Alkaline Phosphatase: 104 IU/L (ref 44–121)
BUN/Creatinine Ratio: 15 (ref 9–20)
BUN: 16 mg/dL (ref 6–24)
Bilirubin Total: 0.8 mg/dL (ref 0.0–1.2)
CO2: 21 mmol/L (ref 20–29)
Calcium: 10.1 mg/dL (ref 8.7–10.2)
Chloride: 106 mmol/L (ref 96–106)
Creatinine, Ser: 1.05 mg/dL (ref 0.76–1.27)
Globulin, Total: 1.9 g/dL (ref 1.5–4.5)
Glucose: 98 mg/dL (ref 70–99)
Potassium: 5.2 mmol/L (ref 3.5–5.2)
Sodium: 142 mmol/L (ref 134–144)
Total Protein: 6.9 g/dL (ref 6.0–8.5)
eGFR: 86 mL/min/{1.73_m2} (ref >59)

## 2021-10-29 LAB — CBC WITH DIFFERENTIAL/PLATELET
Basophils Absolute: 0.1 10*3/uL (ref 0.0–0.2)
Basos: 1 %
EOS (ABSOLUTE): 0.2 10*3/uL (ref 0.0–0.4)
Eos: 3 %
Hematocrit: 50.2 % (ref 37.5–51.0)
Hemoglobin: 17.2 g/dL (ref 13.0–17.7)
Immature Grans (Abs): 0 10*3/uL (ref 0.0–0.1)
Immature Granulocytes: 0 %
Lymphocytes Absolute: 1.2 10*3/uL (ref 0.7–3.1)
Lymphs: 19 %
MCH: 31.3 pg (ref 26.6–33.0)
MCHC: 34.3 g/dL (ref 31.5–35.7)
MCV: 91 fL (ref 79–97)
Monocytes Absolute: 0.7 10*3/uL (ref 0.1–0.9)
Monocytes: 11 %
Neutrophils Absolute: 4.1 10*3/uL (ref 1.4–7.0)
Neutrophils: 66 %
Platelets: 272 10*3/uL (ref 150–450)
RBC: 5.5 x10E6/uL (ref 4.14–5.80)
RDW: 12.4 % (ref 11.6–15.4)
WBC: 6.2 10*3/uL (ref 3.4–10.8)

## 2021-10-29 LAB — HEPATITIS C ANTIBODY: Hep C Virus Ab: NONREACTIVE

## 2021-10-29 LAB — PSA: Prostate Specific Ag, Serum: 1.6 ng/mL (ref 0.0–4.0)

## 2021-10-29 LAB — HEMOGLOBIN A1C
Est. average glucose Bld gHb Est-mCnc: 100 mg/dL
Hgb A1c MFr Bld: 5.1 % (ref 4.8–5.6)

## 2021-10-29 LAB — HIV ANTIBODY (ROUTINE TESTING W REFLEX): HIV Screen 4th Generation wRfx: NONREACTIVE

## 2022-05-01 ENCOUNTER — Other Ambulatory Visit: Payer: Self-pay | Admitting: Family Medicine

## 2022-05-01 ENCOUNTER — Encounter: Payer: Self-pay | Admitting: Family Medicine

## 2022-05-01 ENCOUNTER — Ambulatory Visit (INDEPENDENT_AMBULATORY_CARE_PROVIDER_SITE_OTHER): Payer: BC Managed Care – PPO | Admitting: Family Medicine

## 2022-05-01 VITALS — BP 118/72 | HR 54 | Temp 98.1°F | Resp 16 | Wt 227.8 lb

## 2022-05-01 DIAGNOSIS — N3001 Acute cystitis with hematuria: Secondary | ICD-10-CM | POA: Diagnosis not present

## 2022-05-01 DIAGNOSIS — G8929 Other chronic pain: Secondary | ICD-10-CM

## 2022-05-01 DIAGNOSIS — R82998 Other abnormal findings in urine: Secondary | ICD-10-CM

## 2022-05-01 DIAGNOSIS — M5442 Lumbago with sciatica, left side: Secondary | ICD-10-CM | POA: Diagnosis not present

## 2022-05-01 DIAGNOSIS — H539 Unspecified visual disturbance: Secondary | ICD-10-CM | POA: Diagnosis not present

## 2022-05-01 DIAGNOSIS — Z1211 Encounter for screening for malignant neoplasm of colon: Secondary | ICD-10-CM

## 2022-05-01 LAB — POCT URINALYSIS DIP (CLINITEK)
Bilirubin, UA: NEGATIVE
Glucose, UA: NEGATIVE mg/dL
Ketones, POC UA: NEGATIVE mg/dL
Nitrite, UA: POSITIVE — AB
POC PROTEIN,UA: 100 — AB
Spec Grav, UA: >=1.03 — AB (ref 1.010–1.025)
Urobilinogen, UA: 0.2 E.U./dL
pH, UA: 6 (ref 5.0–8.0)

## 2022-05-01 MED ORDER — PREGABALIN 25 MG PO CAPS: 25.0000 mg | ORAL_CAPSULE | Freq: Two times a day (BID) | ORAL | 0 refills | Status: AC

## 2022-05-01 MED ORDER — SULFAMETHOXAZOLE-TRIMETHOPRIM 800-160 MG PO TABS: 1.0000 | ORAL_TABLET | Freq: Two times a day (BID) | ORAL | 0 refills | Status: AC

## 2022-05-01 NOTE — Progress Notes (Unsigned)
   Established Patient Office Visit  Subjective    Patient ID: [name] [name], male    DOB: [birth year]  Age: 51 y.o. MRN: [id number]  CC: No chief complaint on file.   HPI [name] [name] presents for complaint of foamy and dark colored urine. Headaches behind right eye that had been traumatically damaged in the past with intermittent blurred vision. . Patient last complains of chronic back pain. Denies known trauma or injury.    No outpatient encounter medications on file as of 05/01/2022.   No facility-administered encounter medications on file as of 05/01/2022.    No past medical history on file.  Past Surgical History:  Procedure Laterality Date   APPENDECTOMY      No family history on file.  Social History   Socioeconomic History   Marital status: Single    Spouse name: Not on file   Number of children: Not on file   Years of education: Not on file   Highest education level: Not on file  Occupational History   Not on file  Tobacco Use   Smoking status: Every Day    Packs/day: 0.50    Types: Cigarettes   Smokeless tobacco: Not on file  Substance and Sexual Activity   Alcohol use: Not on file   Drug use: Not on file   Sexual activity: Not on file  Other Topics Concern   Not on file  Social History Narrative   ** Merged History Encounter **       Social Determinants of Health   Financial Resource Strain: Not on file  Food Insecurity: Not on file  Transportation Needs: Not on file  Physical Activity: Not on file  Stress: Not on file  Social Connections: Not on file  Intimate Partner Violence: Not on file    Review of Systems  Constitutional:  Negative for fever.  Genitourinary:  Positive for dysuria.  All other systems reviewed and are negative.       Objective    BP 118/72   Pulse (!) 54   Temp 98.1 F (36.7 C) (Oral)   Resp 16   Wt 227 lb 12.8 oz (103.3 kg)   SpO2 90%   BMI 30.90 kg/m   Physical Exam Vitals and nursing note  reviewed.  Constitutional:      General: He is not in acute distress. Cardiovascular:     Rate and Rhythm: Normal rate and regular rhythm.  Pulmonary:     Effort: Pulmonary effort is normal.     Breath sounds: Normal breath sounds.  Abdominal:     Palpations: Abdomen is soft.     Tenderness: There is no abdominal tenderness.  Neurological:     General: No focal deficit present.     Mental Status: He is alert and oriented to person, place, and time.         Assessment & Plan:   1. Visual disturbance Referral to consultant for further eval/mgt - Ambulatory referral to Ophthalmology  2. Acute cystitis with hematuria Bactrim DS prescribed - POCT URINALYSIS DIP (CLINITEK)  3. Chronic left-sided low back pain with left-sided sciatica ?2/2 above  4. Screening for colon cancer  - Cologuard  No follow-ups on file.   Becky Sax, MD

## 2022-05-05 DIAGNOSIS — Z1211 Encounter for screening for malignant neoplasm of colon: Secondary | ICD-10-CM | POA: Diagnosis not present

## 2022-05-11 LAB — COLOGUARD: COLOGUARD: NEGATIVE

## 2022-06-12 ENCOUNTER — Ambulatory Visit: Payer: BC Managed Care – PPO | Admitting: Family Medicine

## 2022-07-21 DIAGNOSIS — H11152 Pinguecula, left eye: Secondary | ICD-10-CM | POA: Diagnosis not present

## 2022-07-21 DIAGNOSIS — H26101 Unspecified traumatic cataract, right eye: Secondary | ICD-10-CM | POA: Diagnosis not present

## 2022-07-21 DIAGNOSIS — R519 Headache, unspecified: Secondary | ICD-10-CM | POA: Diagnosis not present

## 2022-07-21 DIAGNOSIS — H25042 Posterior subcapsular polar age-related cataract, left eye: Secondary | ICD-10-CM | POA: Diagnosis not present

## 2022-07-25 ENCOUNTER — Encounter: Payer: Self-pay | Admitting: Family Medicine
